# Patient Record
Sex: Male | Born: 1985 | Race: White | Hispanic: No | Marital: Married | Smoking: Never smoker
Health system: Southern US, Community
[De-identification: ages and names within clinical notes are randomized; demographics above are authoritative.]

## PROBLEM LIST (undated history)

## (undated) DIAGNOSIS — F988 Other specified behavioral and emotional disorders with onset usually occurring in childhood and adolescence: Secondary | ICD-10-CM

## (undated) DIAGNOSIS — F909 Attention-deficit hyperactivity disorder, unspecified type: Secondary | ICD-10-CM

## (undated) DIAGNOSIS — R569 Unspecified convulsions: Secondary | ICD-10-CM

## (undated) HISTORY — PX: CHOLECYSTECTOMY: SHX55

---

## 2016-10-02 ENCOUNTER — Encounter: Payer: Self-pay | Admitting: Emergency Medicine

## 2016-10-02 ENCOUNTER — Emergency Department
Admission: EM | Admit: 2016-10-02 | Discharge: 2016-10-02 | Disposition: A | Discharge status: Home / Self Care | Payer: Medicaid Other | Attending: Emergency Medicine | Admitting: Emergency Medicine

## 2016-10-02 DIAGNOSIS — R220 Localized swelling, mass and lump, head: Secondary | ICD-10-CM | POA: Diagnosis present

## 2016-10-02 DIAGNOSIS — F909 Attention-deficit hyperactivity disorder, unspecified type: Secondary | ICD-10-CM | POA: Insufficient documentation

## 2016-10-02 DIAGNOSIS — R590 Localized enlarged lymph nodes: Secondary | ICD-10-CM

## 2016-10-02 HISTORY — DX: Attention-deficit hyperactivity disorder, unspecified type: F90.9

## 2016-10-02 HISTORY — DX: Other specified behavioral and emotional disorders with onset usually occurring in childhood and adolescence: F98.8

## 2016-10-02 HISTORY — DX: Unspecified convulsions: R56.9

## 2016-10-02 NOTE — ED Triage Notes (Signed)
Developed left eye swelling a few days ago  Was seen at Urgent Care and dx'd with pink eye  Now developed some left ear pain and swollen gland  Denies any fever or other sx's

## 2016-10-02 NOTE — ED Provider Notes (Signed)
[facility] Emergency Department Provider Note   ____________________________________________    I have reviewed the triage vital signs and the nursing notes.   HISTORY  Chief Complaint Facial swelling    HPI Ronald Chandler is a 30 y.o. male who presents with complaints of 2 distinct areas of swelling and from his left ear and underneath his left jaw. Patient reports he is being treated for pinkeye the urgent care and has been using eyedrops. He reports his eye has been improving. Denies fevers or chills. Denies ear pain to me. No intraoral pain or difficulty swallowing. No nausea or vomiting. No fevers or chills.   Past Medical History:  Diagnosis Date  . ADD (attention deficit disorder)   . ADHD   . Seizures (HCC)     There are no active problems to display for this patient.   Past Surgical History:  Procedure Laterality Date  . CHOLECYSTECTOMY      Prior to Admission medications   Medication Sig Start Date End Date Taking? Authorizing Provider  lamoTRIgine (LAMICTAL) 100 MG tablet Take 100 mg by mouth daily.   Yes Historical Provider, MD     Allergies Patient has no known allergies.  No family history on file.  Social History Social History  Substance Use Topics  . Smoking status: Never Smoker  . Smokeless tobacco: Never Used  . Alcohol use No    Review of Systems  Constitutional: No fever/chills  ENT: No sore throat.No ear pain   Gastrointestinal: No nausea, no vomiting.    Musculoskeletal: Negative for myalgias Skin: Negative for rash. Neurological: Negative for headaches     ____________________________________________   PHYSICAL EXAM:  VITAL SIGNS: ED Triage Vitals  Enc Vitals Group     BP 10/02/16 0733 (!) 107/59     Pulse Rate 10/02/16 0733 93     Resp 10/02/16 0733 18     Temp 10/02/16 0733 97.5 F (36.4 C)     Temp Source 10/02/16 0733 Oral     SpO2 10/02/16 0733 100 %     Weight 10/02/16 0734  125 lb (56.7 kg)     Height 10/02/16 0734 5\' 9"  (1.753 m)     Head Circumference --      Peak Flow --      Pain Score --      Pain Loc --      Pain Edu? --      Excl. in GC? --     Constitutional: Alert and oriented. No acute distress. Pleasant and interactive Eyes: Left conjunctiva is mildly erythematous otherwise normal exam, no pain with eye movements Head: Atraumatic. Nose: No congestion/rhinnorhea. Mouth/Throat: Mucous membranes are moist. Patient with preauricular lymphadenopathy as well as anterior cervical lymphadenopathy readily palpable. Pharynx is normal. No intraoral infection noted Cardiovascular: Normal rate, regular rhythm.  Respiratory: Normal respiratory effort.  No retractions. Genitourinary: deferred Musculoskeletal: No lower extremity tenderness nor edema.   Neurologic:  Normal speech and language. No gross focal neurologic deficits are appreciated.   Skin:  Skin is warm, dry and intact. No rash noted.   ____________________________________________   LABS (all labs ordered are listed, but only abnormal results are displayed)  Labs Reviewed - No data to display ____________________________________________  EKG   ____________________________________________  RADIOLOGY  None ____________________________________________   PROCEDURES  Procedure(s) performed: No    Critical Care performed: No ____________________________________________   INITIAL IMPRESSION / ASSESSMENT AND PLAN / ED COURSE  Pertinent labs & imaging results  that were available during my care of the patient were reviewed by me and considered in my medical decision making (see chart for details).  Patient presents with likely viral illness resulting in anterior cervical and preauricular lymphadenopathy. Patient reassured. Recommend supportive care and outpatient PCP follow-up as needed. Return precautions discussed   ____________________________________________   FINAL  CLINICAL IMPRESSION(S) / ED DIAGNOSES  Final diagnoses:  Lymphadenopathy, periauricular      NEW MEDICATIONS STARTED DURING THIS VISIT:  New Prescriptions   No medications on file     Note:  This document was prepared using Dragon voice recognition software and may include unintentional dictation errors.    Jene Everyobert Trellis Vanoverbeke, MD 10/02/16 [phone]

## 2018-12-03 ENCOUNTER — Encounter: Payer: Self-pay | Admitting: Emergency Medicine

## 2018-12-03 ENCOUNTER — Other Ambulatory Visit: Payer: Self-pay

## 2018-12-03 ENCOUNTER — Emergency Department
Admission: EM | Admit: 2018-12-03 | Discharge: 2018-12-03 | Disposition: A | Discharge status: Home / Self Care | Payer: Self-pay | Attending: Emergency Medicine | Admitting: Emergency Medicine

## 2018-12-03 DIAGNOSIS — H53143 Visual discomfort, bilateral: Secondary | ICD-10-CM | POA: Insufficient documentation

## 2018-12-03 NOTE — ED Triage Notes (Signed)
Patient complaining of burning and light sensitivity both eyes for a "couple of day's".  Denies wearing lenses or drainage.  No obvious redness noted.

## 2018-12-03 NOTE — ED Provider Notes (Signed)
[facility] Emergency Department Provider Note   ____________________________________________   First MD Initiated Contact with Patient 12/03/18 [phone]     (approximate)  I have reviewed the triage vital signs and the nursing notes.   HISTORY  Chief Complaint Eye Problem    HPI Ronald Chandler is a 33 y.o. male   patient complain of 3 days of photophobia.  Patient denies trauma.  Patient do not wear contact lenses.  Patient is not a Psychologist, occupational.  Patient denies any other vision disturbance.  Patient denies eye pain.  Past Medical History:  Diagnosis Date  . ADD (attention deficit disorder)   . ADHD   . Seizures (HCC)     There are no active problems to display for this patient.   Past Surgical History:  Procedure Laterality Date  . CHOLECYSTECTOMY      Prior to Admission medications   Medication Sig Start Date End Date Taking? Authorizing Provider  lamoTRIgine (LAMICTAL) 100 MG tablet Take 100 mg by mouth daily.    [provider]    Allergies Patient has no known allergies.  No family history on file.  Social History Social History   Tobacco Use  . Smoking status: Never Smoker  . Smokeless tobacco: Never Used  Substance Use Topics  . Alcohol use: No  . Drug use: Never    Review of Systems Valium Constitutional: No fever/chills Eyes: Photophobia. ENT: No sore throat. Cardiovascular: Denies chest pain. Respiratory: Denies shortness of breath. Gastrointestinal: No abdominal pain.  No nausea, no vomiting.  No diarrhea.  No constipation. Genitourinary: Negative for dysuria. Musculoskeletal: Negative for back pain. Skin: Negative for rash. Neurological: Negative for headaches, focal weakness or numbness.   ____________________________________________   PHYSICAL EXAM:  VITAL SIGNS: ED Triage Vitals  Enc Vitals Group     BP 12/03/18 0801 (!) 109/57     Pulse Rate 12/03/18 0801 (!) 51     Resp 12/03/18 0801 12   Temp 12/03/18 0801 98 F (36.7 C)     Temp Source 12/03/18 0801 Oral     SpO2 12/03/18 0801 99 %     Weight 12/03/18 0757 125 lb (56.7 kg)     Height 12/03/18 0757 5\' 9"  (1.753 m)     Head Circumference --      Peak Flow --      Pain Score 12/03/18 0756 6     Pain Loc --      Pain Edu? --      Excl. in GC? --    Constitutional: Alert and oriented. Well appearing and in no acute distress. Eyes: Conjunctivae are normal. PERRL. EOMI. photophobic left eye greater than right. Neck: No stridor. Hematological/Lymphatic/Immunilogical: No cervical lymphadenopathy. Cardiovascular: Normal rate, regular rhythm. Grossly normal heart sounds.  Good peripheral circulation. Respiratory: Normal respiratory effort.  No retractions. Lungs CTAB. Skin:  Skin is warm, dry and intact. No rash noted. Psychiatric: Mood and affect are normal. Speech and behavior are normal.  ____________________________________________   LABS (all labs ordered are listed, but only abnormal results are displayed)  Labs Reviewed - No data to display ____________________________________________  EKG   ____________________________________________  RADIOLOGY  ED MD interpretation:    Official radiology report(s): No results found.  ____________________________________________   PROCEDURES  Procedure(s) performed: None  Procedures  Critical Care performed: No  ____________________________________________   INITIAL IMPRESSION / ASSESSMENT AND PLAN / ED COURSE  As part of my medical decision making, I reviewed the following data within  the electronic MEDICAL RECORD NUMBER     Patient presented with atraumatic photophobia.  Initial exam was unremarkable except for photophobia.  Patient will be consulted to ophthalmology for definitive evaluation and treatment.      ____________________________________________   FINAL CLINICAL IMPRESSION(S) / ED DIAGNOSES  Final diagnoses:  Photophobia, bilateral      ED Discharge Orders    None       Note:  This document was prepared using Dragon voice recognition software and may include unintentional dictation errors.    Joni Reining, PA-C 12/03/18 2233    Dionne Bucy, MD 12/03/18 [phone]

## 2018-12-03 NOTE — Discharge Instructions (Signed)
Follow-up with ophthalmology for definitive evaluation.

## 2018-12-19 ENCOUNTER — Emergency Department
Admission: EM | Admit: 2018-12-19 | Discharge: 2018-12-19 | Disposition: A | Discharge status: Home / Self Care | Payer: Self-pay | Attending: Emergency Medicine | Admitting: Emergency Medicine

## 2018-12-19 ENCOUNTER — Encounter: Payer: Self-pay | Admitting: Emergency Medicine

## 2018-12-19 ENCOUNTER — Other Ambulatory Visit: Payer: Self-pay

## 2018-12-19 ENCOUNTER — Emergency Department: Payer: Self-pay

## 2018-12-19 DIAGNOSIS — B9789 Other viral agents as the cause of diseases classified elsewhere: Secondary | ICD-10-CM

## 2018-12-19 DIAGNOSIS — J069 Acute upper respiratory infection, unspecified: Secondary | ICD-10-CM | POA: Insufficient documentation

## 2018-12-19 DIAGNOSIS — F909 Attention-deficit hyperactivity disorder, unspecified type: Secondary | ICD-10-CM | POA: Insufficient documentation

## 2018-12-19 DIAGNOSIS — Z79899 Other long term (current) drug therapy: Secondary | ICD-10-CM | POA: Insufficient documentation

## 2018-12-19 DIAGNOSIS — R05 Cough: Secondary | ICD-10-CM | POA: Insufficient documentation

## 2018-12-19 MED ORDER — PSEUDOEPH-BROMPHEN-DM 30-2-10 MG/5ML PO SYRP: 5.0000 mL | ORAL_SOLUTION | Freq: Four times a day (QID) | ORAL | 0 refills | Status: DC | PRN

## 2018-12-19 NOTE — Discharge Instructions (Addendum)
Follow-up with your primary care provider or can no clinic acute care if any continued problems.  Increase fluids.  Tylenol or ibuprofen as needed for body aches, ear pain, throat or headache.  Bromfed-DM as needed for cough and congestion.

## 2018-12-19 NOTE — ED Notes (Signed)
See triage note  Presents with body aches subjective fever and ear pain  sxs' started yesterday  Afebrile on arrival

## 2018-12-19 NOTE — ED Triage Notes (Signed)
Pt reports flu-like symptoms that started yesterday and states his left ear is painful as well.

## 2018-12-19 NOTE — ED Provider Notes (Signed)
[facility] Emergency Department Provider Note   ____________________________________________   First MD Initiated Contact with Patient 12/19/18 [phone]     (approximate)  I have reviewed the triage vital signs and the nursing notes.   HISTORY  Chief Complaint Influenza; Fever; Cough; Nasal Congestion; and Otalgia   HPI Ronald Chandler is a 33 y.o. male presents to the ED with onset of body aches, subjective fever and left ear pain since yesterday.  Patient states that he did not actually have any fever last evening but had chills.  He complains of cough but has not taken any over-the-counter medication.  He denies nausea, vomiting or diarrhea.  He rates pain as 6/10.     Past Medical History:  Diagnosis Date  . ADD (attention deficit disorder)   . ADHD   . Seizures (HCC)     There are no active problems to display for this patient.   Past Surgical History:  Procedure Laterality Date  . CHOLECYSTECTOMY      Prior to Admission medications   Medication Sig Start Date End Date Taking? Authorizing Provider  brompheniramine-pseudoephedrine-DM 30-2-10 MG/5ML syrup Take 5 mLs by mouth 4 (four) times daily as needed. 12/19/18   Tommi Rumps, PA-C  lamoTRIgine (LAMICTAL) 100 MG tablet Take 100 mg by mouth daily.    [provider]    Allergies Patient has no known allergies.  No family history on file.  Social History Social History   Tobacco Use  . Smoking status: Never Smoker  . Smokeless tobacco: Never Used  Substance Use Topics  . Alcohol use: No  . Drug use: Never    Review of Systems Constitutional: Subjective fever/chills Eyes: No visual changes. ENT: Positive sore throat.  Positive left ear pain. Cardiovascular: Denies chest pain. Respiratory: Denies shortness of breath. Gastrointestinal: No abdominal pain.  No nausea, no vomiting.  Genitourinary: Negative for dysuria. Musculoskeletal: Negative for back pain. Skin:  Negative for rash. Neurological: Negative for headaches, focal weakness or numbness. ___________________________________________   PHYSICAL EXAM:  VITAL SIGNS: ED Triage Vitals  Enc Vitals Group     BP 12/19/18 0854 (!) 118/56     Pulse Rate 12/19/18 0854 67     Resp 12/19/18 0854 20     Temp 12/19/18 0854 98.7 F (37.1 C)     Temp Source 12/19/18 0854 Oral     SpO2 12/19/18 0854 98 %     Weight 12/19/18 0855 128 lb (58.1 kg)     Height 12/19/18 0855 5\' 8"  (1.727 m)     Head Circumference --      Peak Flow --      Pain Score 12/19/18 0854 6     Pain Loc --      Pain Edu? --      Excl. in GC? --    Constitutional: Alert and oriented. Well appearing and in no acute distress. Eyes: Conjunctivae are normal.  Head: Atraumatic. Nose: No congestion/rhinnorhea.  Mouth/Throat: Mucous membranes are moist.  Oropharynx non-erythematous. Neck: No stridor.  Hematological/Lymphatic/Immunilogical: No cervical lymphadenopathy. Cardiovascular: Normal rate, regular rhythm. Grossly normal heart sounds.  Good peripheral circulation. Respiratory: Normal respiratory effort.  No retractions. Lungs CTAB. Gastrointestinal: Soft and nontender. No distention.  Musculoskeletal: No lower extremity tenderness nor edema.  No joint effusions. Neurologic:  Normal speech and language. No gross focal neurologic deficits are appreciated.  Skin:  Skin is warm, dry and intact. No rash noted. Psychiatric: Mood and affect are normal. Speech and  behavior are normal.  ____________________________________________   LABS (all labs ordered are listed, but only abnormal results are displayed)  Labs Reviewed - No data to display ____________________________________________  RADIOLOGY   Official radiology report(s): Dg Chest 2 View  Result Date: 12/19/2018 CLINICAL DATA:  Cough and cold symptoms. EXAM: CHEST - 2 VIEW COMPARISON:  None. FINDINGS: The cardiac silhouette, mediastinal hilar contours are normal. The  lungs are clear. No pleural effusion. The bony thorax is intact. IMPRESSION: No acute cardiopulmonary findings. Electronically Signed   By: Rudie Meyer M.D.   On: 12/19/2018 09:34    ____________________________________________   PROCEDURES  Procedure(s) performed (including Critical Care):  Procedures   ____________________________________________   INITIAL IMPRESSION / ASSESSMENT AND PLAN / ED COURSE  As part of my medical decision making, I reviewed the following data within the electronic MEDICAL RECORD NUMBER Notes from prior ED visits and [place] Controlled Substance Database     Patient presents to the ED with complaint of body aches, subjective fever and left ear pain that started yesterday.  Patient has not taken any over-the-counter medication.  Physical exam is consistent with a viral URI.  Patient was given a prescription for Bromfed-DM.  He is to follow-up with his PCP or [facility] clinic acute care if any continued problems.   ____________________________________________   FINAL CLINICAL IMPRESSION(S) / ED DIAGNOSES  Final diagnoses:  Viral URI with cough     ED Discharge Orders         Ordered    brompheniramine-pseudoephedrine-DM 30-2-10 MG/5ML syrup  4 times daily PRN     12/19/18 0958           Note:  This document was prepared using Dragon voice recognition software and may include unintentional dictation errors.    Tommi Rumps, PA-C 12/19/18 1410    Jene Every, MD 12/19/18 [phone]

## 2019-01-01 ENCOUNTER — Other Ambulatory Visit: Payer: Self-pay

## 2019-01-01 ENCOUNTER — Emergency Department
Admission: EM | Admit: 2019-01-01 | Discharge: 2019-01-01 | Disposition: A | Discharge status: Home / Self Care | Payer: Self-pay | Attending: Emergency Medicine | Admitting: Emergency Medicine

## 2019-01-01 ENCOUNTER — Encounter: Payer: Self-pay | Admitting: Emergency Medicine

## 2019-01-01 DIAGNOSIS — Z79899 Other long term (current) drug therapy: Secondary | ICD-10-CM | POA: Insufficient documentation

## 2019-01-01 DIAGNOSIS — F17200 Nicotine dependence, unspecified, uncomplicated: Secondary | ICD-10-CM | POA: Insufficient documentation

## 2019-01-01 DIAGNOSIS — R202 Paresthesia of skin: Secondary | ICD-10-CM | POA: Insufficient documentation

## 2019-01-01 MED ORDER — METHYLPREDNISOLONE 4 MG PO TBPK: ORAL_TABLET | ORAL | 0 refills | Status: DC

## 2019-01-01 NOTE — ED Triage Notes (Signed)
Pt in via POV, states he was playing with his daughter and heard a "pop" in his hand.  Since, hand has been tingling, numb, cold to touch.  Full ROM noted, strong right radial pulse palpated.  Ambulatory to triage, NAD noted.

## 2019-01-01 NOTE — Discharge Instructions (Addendum)
Follow discharge care instruction wear wrist splint for 2 to 3 days as needed. °

## 2019-01-01 NOTE — ED Provider Notes (Signed)
[facility] Emergency Department Provider Note  ____________________________________________   First MD Initiated Contact with Patient 01/01/19 0805     (approximate)  I have reviewed the triage vital signs and the nursing notes.   HISTORY  Chief Complaint Hand Injury    HPI Ronald Chandler is a 33 y.o. male patient complain of numbness to the right hand.  Patient that he was playing with his daughter last night when he felt a "pop" in his hand.  Since incident patient has tingling and numbness and stated pain is cold to touch.  Patient denies decreased range of motion.  Patient is left-hand dominant.         Past Medical History:  Diagnosis Date  . ADD (attention deficit disorder)   . ADHD   . Seizures (HCC)     There are no active problems to display for this patient.   Past Surgical History:  Procedure Laterality Date  . CHOLECYSTECTOMY      Prior to Admission medications   Medication Sig Start Date End Date Taking? Authorizing Provider  lamoTRIgine (LAMICTAL) 100 MG tablet Take 100 mg by mouth daily.    [provider]  methylPREDNISolone (MEDROL DOSEPAK) 4 MG TBPK tablet Take Tapered dose as directed 01/01/19   Joni Reining, PA-C    Allergies Patient has no known allergies.  No family history on file.  Social History Social History   Tobacco Use  . Smoking status: Current Some Day Smoker  . Smokeless tobacco: Never Used  Substance Use Topics  . Alcohol use: No  . Drug use: Never    Review of Systems Constitutional: No fever/chills Eyes: No visual changes. ENT: No sore throat. Cardiovascular: Denies chest pain. Respiratory: Denies shortness of breath. Gastrointestinal: No abdominal pain.  No nausea, no vomiting.  No diarrhea.  No constipation. Genitourinary: Negative for dysuria. Musculoskeletal: Negative for back pain. Skin: Negative for rash. Neurological: Numbness to right hand.   Psychiatric:  ADHD.   ____________________________________________   PHYSICAL EXAM:  VITAL SIGNS: ED Triage Vitals  Enc Vitals Group     BP 01/01/19 0748 113/66     Pulse Rate 01/01/19 0748 73     Resp 01/01/19 0748 16     Temp 01/01/19 0748 98 F (36.7 C)     Temp Source 01/01/19 0748 Oral     SpO2 01/01/19 0748 96 %     Weight 01/01/19 0745 132 lb (59.9 kg)     Height 01/01/19 0745 5\' 9"  (1.753 m)     Head Circumference --      Peak Flow --      Pain Score 01/01/19 0745 0     Pain Loc --      Pain Edu? --      Excl. in GC? --     Constitutional: Alert and oriented. Well appearing and in no acute distress. Neck: No cervical spine tenderness to palpation. Cardiovascular: Normal rate, regular rhythm. Grossly normal heart sounds.  Good radial pulse.  Capillary refill less than 2 seconds. Respiratory: Normal respiratory effort.  No retractions. Lungs CTAB. Musculoskeletal: No obvious deformity to the right hand.  Patient has full and equal range of motion of the hand...  . Neurologic:  No gross focal neurologic deficits are appreciated.  Negative Tinel's and Phalen's.   Skin:  Skin is warm, dry and intact. No rash noted. Psychiatric: Mood and affect are normal. Speech and behavior are normal.  ____________________________________________   LABS (all labs  ordered are listed, but only abnormal results are displayed)  Labs Reviewed - No data to display ____________________________________________  EKG   ____________________________________________  RADIOLOGY  ED MD interpretation:    Official radiology report(s): No results found.  ____________________________________________   PROCEDURES  Procedure(s) performed (including Critical Care):  Procedures   ____________________________________________   INITIAL IMPRESSION / ASSESSMENT AND PLAN / ED COURSE  As part of my medical decision making, I reviewed the following data within the electronic MEDICAL RECORD NUMBER          Right hand paresthesia.  Patient given discharge care instructions.  Patient placed in hands splint and given a short course of steroids.  Patient advised follow-up with neurology if no improvement or worsening complaint in the next 2 to 3 days.      ____________________________________________   FINAL CLINICAL IMPRESSION(S) / ED DIAGNOSES  Final diagnoses:  Right hand paresthesia     ED Discharge Orders         Ordered    methylPREDNISolone (MEDROL DOSEPAK) 4 MG TBPK tablet     01/01/19 0809           Note:  This document was prepared using Dragon voice recognition software and may include unintentional dictation errors.    Joni Reining, PA-C 01/01/19 1443    Nita Sickle, MD 01/01/19 [phone]

## 2019-01-01 NOTE — ED Notes (Signed)
See triage note   States he developed pain to right hand after playing with his daughter  States he felt a "pop"   And states he has had cold sensation  Hand is warm to touch at presents with good pulses  Grip equal

## 2019-04-11 ENCOUNTER — Emergency Department
Admission: EM | Admit: 2019-04-11 | Discharge: 2019-04-11 | Disposition: A | Discharge status: Home / Self Care | Payer: Medicaid Other | Attending: Emergency Medicine | Admitting: Emergency Medicine

## 2019-04-11 ENCOUNTER — Other Ambulatory Visit: Payer: Self-pay

## 2019-04-11 ENCOUNTER — Encounter: Payer: Self-pay | Admitting: Intensive Care

## 2019-04-11 DIAGNOSIS — Z79899 Other long term (current) drug therapy: Secondary | ICD-10-CM | POA: Insufficient documentation

## 2019-04-11 DIAGNOSIS — R300 Dysuria: Secondary | ICD-10-CM | POA: Insufficient documentation

## 2019-04-11 DIAGNOSIS — F17200 Nicotine dependence, unspecified, uncomplicated: Secondary | ICD-10-CM | POA: Insufficient documentation

## 2019-04-11 DIAGNOSIS — R569 Unspecified convulsions: Secondary | ICD-10-CM | POA: Insufficient documentation

## 2019-04-11 LAB — URINALYSIS, COMPLETE (UACMP) WITH MICROSCOPIC
Bacteria, UA: NONE SEEN
Bilirubin Urine: NEGATIVE
Glucose, UA: NEGATIVE mg/dL
Hgb urine dipstick: NEGATIVE
Ketones, ur: NEGATIVE mg/dL
Nitrite: NEGATIVE
Protein, ur: NEGATIVE mg/dL
Specific Gravity, Urine: 1.017 (ref 1.005–1.030)
Squamous Epithelial / LPF: NONE SEEN (ref 0–5)
pH: 7 (ref 5.0–8.0)

## 2019-04-11 MED ORDER — PHENAZOPYRIDINE HCL 200 MG PO TABS: 200.0000 mg | ORAL_TABLET | Freq: Three times a day (TID) | ORAL | 0 refills | Status: DC | PRN

## 2019-04-11 MED ORDER — SULFAMETHOXAZOLE-TRIMETHOPRIM 800-160 MG PO TABS: 1.0000 | ORAL_TABLET | Freq: Two times a day (BID) | ORAL | 0 refills | Status: DC

## 2019-04-11 NOTE — ED Notes (Signed)
See triage note States he developed some burning with urination  And some discomfort in lower abd  No fever or discharge

## 2019-04-11 NOTE — ED Triage Notes (Signed)
Patient c/o burning upon urination since yesterday. Denies abnormal discharge. No rash

## 2019-04-11 NOTE — ED Provider Notes (Signed)
[facility] Emergency Department Provider Note   ____________________________________________   First MD Initiated Contact with Patient 04/11/19 1239     (approximate)  I have reviewed the triage vital signs and the nursing notes.   HISTORY  Chief Complaint Urinary Tract Infection    HPI Ronald Chandler is a 33 y.o. male patient presents with 1 day of dysuria.  Patient denies urinary frequency/urgency.  Patient denies urethral discharge.  Patient is in a monogamous relationship.  Patient denies fever/chills.  Patient denies flank pain.  Patient rates pain as a 6/10.  Patient described the pain as "burning".  No palliative measure for complaint.  Patient stated left work early days secondary to concern of complaint.         Past Medical History:  Diagnosis Date  . ADD (attention deficit disorder)   . ADHD   . Seizures ([place])     There are no active problems to display for this patient.   Past Surgical History:  Procedure Laterality Date  . CHOLECYSTECTOMY      Prior to Admission medications   Medication Sig Start Date End Date Taking? Authorizing Provider  lamoTRIgine (LAMICTAL) 100 MG tablet Take 100 mg by mouth daily.    [provider]  phenazopyridine (PYRIDIUM) 200 MG tablet Take 1 tablet (200 mg total) by mouth 3 (three) times daily as needed for pain. 04/11/19   Sable Feil, PA-C  sulfamethoxazole-trimethoprim (BACTRIM DS) 800-160 MG tablet Take 1 tablet by mouth 2 (two) times daily. 04/11/19   Sable Feil, PA-C    Allergies Patient has no known allergies.  History reviewed. No pertinent family history.  Social History Social History   Tobacco Use  . Smoking status: Current Some Day Smoker  . Smokeless tobacco: Never Used  Substance Use Topics  . Alcohol use: No  . Drug use: Yes    Types: Marijuana    Review of Systems  Constitutional: No fever/chills Eyes: No visual changes. ENT: No sore throat.  Cardiovascular: Denies chest pain. Respiratory: Denies shortness of breath. Gastrointestinal: No abdominal pain.  No nausea, no vomiting.  No diarrhea.  No constipation. Genitourinary: Positive for dysuria. Musculoskeletal: Negative for back pain. Skin: Negative for rash. Neurological: Negative for headaches, focal weakness or numbness.  Seizures. Psychiatric:  ADD  ____________________________________________   PHYSICAL EXAM:  VITAL SIGNS: ED Triage Vitals  Enc Vitals Group     BP 04/11/19 1240 122/70     Pulse Rate 04/11/19 1240 78     Resp 04/11/19 1240 18     Temp 04/11/19 1240 98.6 F (37 C)     Temp Source 04/11/19 1240 Oral     SpO2 04/11/19 1240 99 %     Weight 04/11/19 1205 126 lb (57.2 kg)     Height 04/11/19 1205 5\' 9"  (1.753 m)     Head Circumference --      Peak Flow --      Pain Score 04/11/19 1205 6     Pain Loc --      Pain Edu? --      Excl. in [place]? --    Constitutional: Alert and oriented. Well appearing and in no acute distress. Cardiovascular: Normal rate, regular rhythm. Grossly normal heart sounds.  Good peripheral circulation. Respiratory: Normal respiratory effort.  No retractions. Lungs CTAB. Gastrointestinal: Soft and nontender. No distention. No abdominal bruits. No CVA tenderness. Genitourinary: No visible lesions.  No obvious urethral discharge. Musculoskeletal: No lower extremity tenderness nor  edema.  No joint effusions. Neurologic:  Normal speech and language. No gross focal neurologic deficits are appreciated. No gait instability. Skin:  Skin is warm, dry and intact. No rash noted. Psychiatric: Mood and affect are normal. Speech and behavior are normal.  ____________________________________________   LABS (all labs ordered are listed, but only abnormal results are displayed)  Labs Reviewed  URINALYSIS, COMPLETE (UACMP) WITH MICROSCOPIC - Abnormal; Notable for the following components:      Result Value   Color, Urine YELLOW (*)     APPearance CLEAR (*)    Leukocytes,Ua TRACE (*)    All other components within normal limits   ____________________________________________  EKG   ____________________________________________  RADIOLOGY  ED MD interpretation:    Official radiology report(s): No results found.  ____________________________________________   PROCEDURES  Procedure(s) performed (including Critical Care):  Procedures   ____________________________________________   INITIAL IMPRESSION / ASSESSMENT AND PLAN / ED COURSE  As part of my medical decision making, I reviewed the following data within the electronic MEDICAL RECORD NUMBER         Patient presents with 1 day of dysuria.  Patient denies urinary frequency/urgency.  Patient denies urethral discharge.  Patient UA shows a trace of leukocytes.  Patient will be treated prophylactically with Bactrim DS and Pyridium.  Patient [facility] if condition worsens.      ____________________________________________   FINAL CLINICAL IMPRESSION(S) / ED DIAGNOSES  Final diagnoses:  Dysuria     ED Discharge Orders         Ordered    sulfamethoxazole-trimethoprim (BACTRIM DS) 800-160 MG tablet  2 times daily     04/11/19 1243    phenazopyridine (PYRIDIUM) 200 MG tablet  3 times daily PRN     04/11/19 1243           Note:  This document was prepared using Dragon voice recognition software and may include unintentional dictation errors.    Joni ReiningSmith, Charman Blasco K, PA-C 04/11/19 1248    Emily FilbertWilliams, Jonathan E, MD 04/11/19 1322

## 2019-04-12 ENCOUNTER — Emergency Department
Admission: EM | Admit: 2019-04-12 | Discharge: 2019-04-12 | Disposition: A | Discharge status: Home / Self Care | Payer: Medicaid Other | Attending: Emergency Medicine | Admitting: Emergency Medicine

## 2019-04-12 ENCOUNTER — Other Ambulatory Visit: Payer: Self-pay

## 2019-04-12 DIAGNOSIS — R21 Rash and other nonspecific skin eruption: Secondary | ICD-10-CM | POA: Insufficient documentation

## 2019-04-12 DIAGNOSIS — F1210 Cannabis abuse, uncomplicated: Secondary | ICD-10-CM | POA: Insufficient documentation

## 2019-04-12 DIAGNOSIS — F17200 Nicotine dependence, unspecified, uncomplicated: Secondary | ICD-10-CM | POA: Insufficient documentation

## 2019-04-12 MED ORDER — AMOXICILLIN 500 MG PO CAPS: 500.0000 mg | ORAL_CAPSULE | Freq: Three times a day (TID) | ORAL | 0 refills | Status: DC

## 2019-04-12 NOTE — ED Triage Notes (Signed)
Pt states that he was seen in ED yesterday for UTI and given prescriptions - he reports that since taking the medication he has urgency with voiding and only voided x2 today - pt reports that while at work he had a "rash" break out on his neck and his supervisor told him to come to the ED - no rash noted at this time - pt denies any pain or SHOB - no difficulty swallowing

## 2019-04-12 NOTE — ED Provider Notes (Signed)
[facility] Emergency Department Provider Note   ____________________________________________   None    (approximate)  I have reviewed the triage vital signs and the nursing notes.   HISTORY  Chief Complaint Rash    HPI Ronald Chandler is a 33 y.o. male patient presents for rash that presents around his neck to the upper chest.  Patient was diagnosed with UTI yesterday and started on Bactrim DS.  Patient denies itching with the rash.  Patient stated rash started on second doses.  Patient is not taking a doses today.  No rash present at this time.  No personal family history of allergic reactions to sulfa medications.  Patient states took Benadryl earlier today.         Past Medical History:  Diagnosis Date  . ADD (attention deficit disorder)   . ADHD   . Seizures ([place])     There are no active problems to display for this patient.   Past Surgical History:  Procedure Laterality Date  . CHOLECYSTECTOMY      Prior to Admission medications   Medication Sig Start Date End Date Taking? Authorizing Provider  amoxicillin (AMOXIL) 500 MG capsule Take 1 capsule (500 mg total) by mouth 3 (three) times daily. 04/12/19   Sable Feil, PA-C  lamoTRIgine (LAMICTAL) 100 MG tablet Take 100 mg by mouth daily.    [provider]  phenazopyridine (PYRIDIUM) 200 MG tablet Take 1 tablet (200 mg total) by mouth 3 (three) times daily as needed for pain. 04/11/19   Sable Feil, PA-C  sulfamethoxazole-trimethoprim (BACTRIM DS) 800-160 MG tablet Take 1 tablet by mouth 2 (two) times daily. 04/11/19   Sable Feil, PA-C    Allergies Patient has no known allergies.  No family history on file.  Social History Social History   Tobacco Use  . Smoking status: Current Some Day Smoker  . Smokeless tobacco: Never Used  Substance Use Topics  . Alcohol use: No  . Drug use: Yes    Types: Marijuana    Review of Systems Constitutional: No fever/chills  Eyes: No visual changes. ENT: No sore throat. Cardiovascular: Denies chest pain. Respiratory: Denies shortness of breath. Gastrointestinal: No abdominal pain.  No nausea, no vomiting.  No diarrhea.  No constipation. Genitourinary: Negative for dysuria. Musculoskeletal: Negative for back pain. Skin: Negative for rash. Neurological: Negative for headaches, focal weakness or numbness.  Seizures. Psychiatric:  ADD.  ____________________________________________   PHYSICAL EXAM:  VITAL SIGNS: ED Triage Vitals  Enc Vitals Group     BP 04/12/19 1418 127/84     Pulse Rate 04/12/19 1418 69     Resp 04/12/19 1418 15     Temp 04/12/19 1418 98.4 F (36.9 C)     Temp Source 04/12/19 1418 Oral     SpO2 04/12/19 1418 99 %     Weight 04/12/19 1419 130 lb (59 kg)     Height 04/12/19 1419 5\' 9"  (1.753 m)     Head Circumference --      Peak Flow --      Pain Score 04/12/19 1419 0     Pain Loc --      Pain Edu? --      Excl. in [place]? --    Constitutional: Alert and oriented. Well appearing and in no acute distress. Cardiovascular: Normal rate, regular rhythm. Grossly normal heart sounds.  Good peripheral circulation. Respiratory: Normal respiratory effort.  No retractions. Lungs CTAB. Genitourinary: Deferred Skin:  Skin is  warm, dry and intact. No rash noted. Psychiatric: Mood and affect are normal. Speech and behavior are normal.  ____________________________________________   LABS (all labs ordered are listed, but only abnormal results are displayed)  Labs Reviewed - No data to display ____________________________________________  EKG   ____________________________________________  RADIOLOGY  ED MD interpretation:    Official radiology report(s): No results found.  ____________________________________________   PROCEDURES  Procedure(s) performed (including Critical Care):  Procedures   ____________________________________________   INITIAL IMPRESSION /  ASSESSMENT AND PLAN / ED COURSE  As part of my medical decision making, I reviewed the following data within the electronic MEDICAL RECORD NUMBER         Patient presents with rash to his neck and upper body which occurred after taking Bactrim DS.  Patient denies itching or any other anaphylactic signs and symptoms.  Discussed rationale for discontinuing medication although low suspicion since this seems to be a localized reaction.  Will change Bactrim to amoxicillin and have patient [facility] as [facility].      ____________________________________________   FINAL CLINICAL IMPRESSION(S) / ED DIAGNOSES  Final diagnoses:  Rash     ED Discharge Orders         Ordered    amoxicillin (AMOXIL) 500 MG capsule  3 times daily     04/12/19 1511           Note:  This document was prepared using Dragon voice recognition software and may include unintentional dictation errors.    Joni ReiningSmith,  K, PA-C 04/12/19 1517    Emily FilbertWilliams, Jonathan E, MD 04/12/19 [phone]

## 2019-04-12 NOTE — Discharge Instructions (Addendum)
There is a low suspicion that your rash is caused by Bactrim which was prescribed for yesterday.  However we will discontinue medication and start you on amoxicillin.  If rash reoccurs be advised not from the medication.

## 2019-04-28 ENCOUNTER — Emergency Department: Payer: Self-pay

## 2019-04-28 ENCOUNTER — Encounter: Payer: Self-pay | Admitting: Emergency Medicine

## 2019-04-28 ENCOUNTER — Emergency Department
Admission: EM | Admit: 2019-04-28 | Discharge: 2019-04-28 | Disposition: A | Discharge status: Home / Self Care | Payer: Self-pay | Attending: Emergency Medicine | Admitting: Emergency Medicine

## 2019-04-28 ENCOUNTER — Other Ambulatory Visit: Payer: Self-pay

## 2019-04-28 DIAGNOSIS — Y92007 Garden or yard of unspecified non-institutional (private) residence as the place of occurrence of the external cause: Secondary | ICD-10-CM | POA: Insufficient documentation

## 2019-04-28 DIAGNOSIS — F17210 Nicotine dependence, cigarettes, uncomplicated: Secondary | ICD-10-CM | POA: Insufficient documentation

## 2019-04-28 DIAGNOSIS — Z79899 Other long term (current) drug therapy: Secondary | ICD-10-CM | POA: Insufficient documentation

## 2019-04-28 DIAGNOSIS — W2209XA Striking against other stationary object, initial encounter: Secondary | ICD-10-CM | POA: Insufficient documentation

## 2019-04-28 DIAGNOSIS — Y93H2 Activity, gardening and landscaping: Secondary | ICD-10-CM | POA: Insufficient documentation

## 2019-04-28 DIAGNOSIS — Y999 Unspecified external cause status: Secondary | ICD-10-CM | POA: Insufficient documentation

## 2019-04-28 DIAGNOSIS — S0990XA Unspecified injury of head, initial encounter: Secondary | ICD-10-CM | POA: Insufficient documentation

## 2019-04-28 MED ORDER — IBUPROFEN 600 MG PO TABS: 600.0000 mg | ORAL_TABLET | Freq: Three times a day (TID) | ORAL | 0 refills | Status: DC | PRN

## 2019-04-28 NOTE — ED Triage Notes (Signed)
Pt to ED via POV stating that he was cutting grass yesterday and he hit his head on a metal laundry pole. Pt states that he is now having tightness and in his jaw and intermittent swelling in his left eye. No swelling noted at this time. Pt is in NAD.

## 2019-04-28 NOTE — ED Provider Notes (Signed)
[facility] Emergency Department Provider Note  ____________________________________________   First MD Initiated Contact with Patient 04/28/19 [phone]     (approximate)  I have reviewed the triage vital signs and the nursing notes.   HISTORY  Chief Complaint Head Injury    HPI Ronald Chandler is a 33 y.o. male with history of seizure disorder, ADHD, here with headache.  The patient states that he was doing yard work the day before yesterday.  He states that he backed into a pole accidentally and struck his head.  He briefly "saw stars" but did not actually pass out.  Since then, has had a moderate, aching, posterior headache that radiates around towards his eye.  He woke up today and his fiance thought that his left face looked swollen so he presents for evaluation.  He states that this has happened before because is dentures fit differently, though he denies any tooth or gum pain.  Denies any other trauma.  No neck pain or neck stiffness.  He does have some mild photophobia on the left.  No actual eye pain.  The pain is worse with palpation of the head.  No alleviating factors.  He has not taken anything.        Past Medical History:  Diagnosis Date  . ADD (attention deficit disorder)   . ADHD   . Seizures ([facility])     There are no active problems to display for this patient.   Past Surgical History:  Procedure Laterality Date  . CHOLECYSTECTOMY      Prior to Admission medications   Medication Sig Start Date End Date Taking? Authorizing Provider  amoxicillin (AMOXIL) 500 MG capsule Take 1 capsule (500 mg total) by mouth 3 (three) times daily. 04/12/19   Joni ReiningSmith, Ronald K, PA-C  ibuprofen (ADVIL) 600 MG tablet Take 1 tablet (600 mg total) by mouth every 8 (eight) hours as needed for moderate pain. 04/28/19   Shaune PollackIsaacs, Kaylan Yates, MD  lamoTRIgine (LAMICTAL) 100 MG tablet Take 100 mg by mouth daily.    [provider]  phenazopyridine (PYRIDIUM) 200 MG  tablet Take 1 tablet (200 mg total) by mouth 3 (three) times daily as needed for pain. 04/11/19   Joni ReiningSmith, Ronald K, PA-C  sulfamethoxazole-trimethoprim (BACTRIM DS) 800-160 MG tablet Take 1 tablet by mouth 2 (two) times daily. 04/11/19   Joni ReiningSmith, Ronald K, PA-C    Allergies Patient has no known allergies.  No family history on file.  Social History Social History   Tobacco Use  . Smoking status: Current Some Day Smoker  . Smokeless tobacco: Never Used  Substance Use Topics  . Alcohol use: No  . Drug use: Yes    Types: Marijuana    Review of Systems  Review of Systems  Constitutional: Positive for fatigue. Negative for chills and fever.  HENT: Negative for sore throat.   Respiratory: Negative for shortness of breath.   Cardiovascular: Negative for chest pain.  Gastrointestinal: Negative for abdominal pain.  Genitourinary: Negative for flank pain.  Musculoskeletal: Negative for neck pain.  Skin: Negative for rash and wound.  Allergic/Immunologic: Negative for immunocompromised state.  Neurological: Positive for headaches. Negative for weakness and numbness.  Hematological: Does not bruise/bleed easily.     ____________________________________________  PHYSICAL EXAM:      VITAL SIGNS: ED Triage Vitals  Enc Vitals Group     BP 04/28/19 0706 108/68     Pulse Rate 04/28/19 0706 (!) 44     Resp 04/28/19 0706  16     Temp 04/28/19 0706 97.7 F (36.5 C)     Temp Source 04/28/19 0706 Oral     SpO2 04/28/19 0706 100 %     Weight 04/28/19 0707 130 lb (59 kg)     Height 04/28/19 0707 5\' 9"  (1.753 m)     Head Circumference --      Peak Flow --      Pain Score 04/28/19 0707 0     Pain Loc --      Pain Edu? --      Excl. in [place]? --      Physical Exam Vitals signs and nursing note reviewed.  Constitutional:      General: He is not in acute distress.    Appearance: He is well-developed.  HENT:     Head: Normocephalic and atraumatic.     Comments: Mild tenderness over the  left posterior occiput without appreciable swelling.  No crepitance or bony instability.  No hemotympanum bilaterally.  No periorbital or postauricular ecchymoses. Eyes:     Conjunctiva/sclera: Conjunctivae normal.  Neck:     Musculoskeletal: Neck supple.  Cardiovascular:     Rate and Rhythm: Normal rate and regular rhythm.     Heart sounds: Normal heart sounds. No murmur. No friction rub.  Pulmonary:     Effort: Pulmonary effort is normal. No respiratory distress.     Breath sounds: Normal breath sounds. No wheezing or rales.  Abdominal:     General: There is no distension.     Palpations: Abdomen is soft.     Tenderness: There is no abdominal tenderness.  Skin:    General: Skin is warm.     Capillary Refill: Capillary refill takes less than 2 seconds.  Neurological:     Mental Status: He is alert and oriented to person, place, and time.     Motor: No abnormal muscle tone.       ____________________________________________   LABS (all labs ordered are listed, but only abnormal results are displayed)  Labs Reviewed - No data to display  ____________________________________________  EKG: Sinus bradycardia, ventricular rate 48.  PR 136, QRS 102, QTc 380.  Benign early re-pole.  No acute ischemic changes. ________________________________________  RADIOLOGY All imaging, including plain films, CT scans, and ultrasounds, independently reviewed by me, and interpretations confirmed via formal radiology reads.  ED MD interpretation:   CT Head: [place]  Official radiology report(s): Ct Head Wo Contrast  Result Date: 04/28/2019 CLINICAL DATA:  Headache after hitting head against solid object EXAM: CT HEAD WITHOUT CONTRAST TECHNIQUE: Contiguous axial images were obtained from the base of the skull through the vertex without intravenous contrast. COMPARISON:  None. FINDINGS: Brain: The ventricles are normal in size and configuration. There is prominence of the cisterna magna, an  anatomic variant. There is no intracranial mass, hemorrhage, extra-axial fluid collection, or midline shift. Brain parenchyma appears unremarkable. No acute infarct evident. Vascular: There is no hyperdense vessel. There is no appreciable vascular calcification. Skull: The bony calvarium appears intact. Sinuses/Orbits: There is a small retention cyst in a right ethmoid air cell. Other visualized paranasal sinuses are clear. Visualized orbits appear symmetric bilaterally. Other: Mastoid air cells are clear. IMPRESSION: Small retention cyst in a right ethmoid air cell. Study otherwise unremarkable. 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 Electronically Signed   By: Lowella Grip III M.D.   On: 04/28/2019 08:25    ____________________________________________  PROCEDURES   Procedure(s) performed (including Critical Care):  Procedures  ____________________________________________  INITIAL IMPRESSION / MDM / ASSESSMENT AND PLAN / ED COURSE  As part of my medical decision making, I reviewed the following data within the electronic MEDICAL RECORD NUMBER Notes from prior ED visits and [place] Controlled Substance Databases      *Ronald Chandler was evaluated in Emergency Department on 04/28/2019 for the symptoms described in the history of present illness. He was evaluated in the context of the global COVID-19 pandemic, which necessitated consideration that the patient might be at risk for infection with the SARS-CoV-2 virus that causes COVID-19. Institutional protocols and algorithms that pertain to the evaluation of patients at risk for COVID-19 are in a state of rapid change based on information released by regulatory bodies including the CDC and federal and state organizations. These policies and algorithms were followed during the patient's care in the ED.  Some ED evaluations and interventions may be delayed as a result of limited staffing during the pandemic.*   Clinical Course as of Apr 27 838   Sun Apr 28, 2019  0805 33 yo M here with headache after head injury 2 days ago. Mild bradycardia noted on arrival. While this could be his baseline, given temporal pain, +trauma, reported hematoma, will check CT. Swelling could also be 2/2 poorly fitting dentures though no signs of gingival or alveolar ridge infection on my exam. F/u CT.   [CI]  [id number] CT head shows NAICA. Suspect his bradycardia is physiologic. Will treat for possible mild concussion, provide w/ work note, and d/c home.   [CI]    Clinical Course User Index [CI] Shaune PollackIsaacs, Abbegale Stehle, MD    Medical Decision Making: As above.  ____________________________________________  FINAL CLINICAL IMPRESSION(S) / ED DIAGNOSES  Final diagnoses:  Injury of head, initial encounter     MEDICATIONS GIVEN DURING THIS VISIT:  Medications - No data to display   ED Discharge Orders         Ordered    ibuprofen (ADVIL) 600 MG tablet  Every 8 hours PRN     04/28/19 0838           Note:  This document was prepared using Dragon voice recognition software and may include unintentional dictation errors.   Shaune PollackIsaacs, Pretty Weltman, MD 04/28/19 [phone]

## 2019-04-28 NOTE — ED Notes (Signed)
Pt says he was mowing the grass yesterday when he backed up and hit the back left side of his head on a metal pole hanging in his neighbor's yard; pt says his left cheek and jaw line feel tight; and he says his wife told him he looked like he has some swelling under his left eye this am; no swelling observed by this RN at this time

## 2019-06-28 ENCOUNTER — Emergency Department
Admission: EM | Admit: 2019-06-28 | Discharge: 2019-06-28 | Disposition: A | Discharge status: Home / Self Care | Payer: Medicaid Other | Attending: Emergency Medicine | Admitting: Emergency Medicine

## 2019-06-28 ENCOUNTER — Other Ambulatory Visit: Payer: Self-pay

## 2019-06-28 DIAGNOSIS — J040 Acute laryngitis: Secondary | ICD-10-CM | POA: Insufficient documentation

## 2019-06-28 DIAGNOSIS — J029 Acute pharyngitis, unspecified: Secondary | ICD-10-CM

## 2019-06-28 DIAGNOSIS — Z79899 Other long term (current) drug therapy: Secondary | ICD-10-CM | POA: Insufficient documentation

## 2019-06-28 DIAGNOSIS — F909 Attention-deficit hyperactivity disorder, unspecified type: Secondary | ICD-10-CM | POA: Insufficient documentation

## 2019-06-28 DIAGNOSIS — F17210 Nicotine dependence, cigarettes, uncomplicated: Secondary | ICD-10-CM | POA: Insufficient documentation

## 2019-06-28 LAB — GROUP A STREP BY PCR: Group A Strep by PCR: NOT DETECTED

## 2019-06-28 MED ORDER — LIDOCAINE VISCOUS HCL 2 % MT SOLN
5.0000 mL | Freq: Four times a day (QID) | OROMUCOSAL | 0 refills | Status: AC | PRN
Start: 2019-06-28 — End: ?

## 2019-06-28 MED ORDER — METHYLPREDNISOLONE 4 MG PO TBPK: ORAL_TABLET | ORAL | 0 refills | Status: AC

## 2019-06-28 NOTE — ED Notes (Addendum)
See triage note  Presents with sore throat   Afebrile   Also noticed that voice is hoarse  No diff swallowing    Denies any pain

## 2019-06-28 NOTE — ED Provider Notes (Signed)
[facility] Emergency Department Provider Note   ____________________________________________   First MD Initiated Contact with Patient 06/28/19 0912     (approximate)  I have reviewed the triage vital signs and the nursing notes.   HISTORY  Chief Complaint Hoarse and Sore Throat    HPI Ronald Chandler is a 33 y.o. male patient waking this morning with decreased voice volume and sore throat.  Patient denies other URI signs and symptoms.  Patient denies recent travel.  Patient denies knowledge of recent exposure to COVID-19.  Patient is able to tolerate food and fluids.  Patient rates his pain as a 6/10.  Patient described the pain as "sore.  No palliative measure for complaint.         Past Medical History:  Diagnosis Date  . ADD (attention deficit disorder)   . ADHD   . Seizures (HCC)     There are no active problems to display for this patient.   Past Surgical History:  Procedure Laterality Date  . CHOLECYSTECTOMY      Prior to Admission medications   Medication Sig Start Date End Date Taking? Authorizing Provider  lamoTRIgine (LAMICTAL) 100 MG tablet Take 100 mg by mouth daily.    [provider]  lidocaine (XYLOCAINE) 2 % solution Use as directed 5 mLs in the mouth or throat every 6 (six) hours as needed for mouth pain. Oral swish and swallow. 06/28/19   Joni ReiningSmith, Nylene Inlow K, PA-C  methylPREDNISolone (MEDROL DOSEPAK) 4 MG TBPK tablet Take Tapered dose as directed 06/28/19   Joni ReiningSmith, Ari Bernabei K, PA-C    Allergies Patient has no known allergies.  No family history on file.  Social History Social History   Tobacco Use  . Smoking status: Current Some Day Smoker  . Smokeless tobacco: Never Used  Substance Use Topics  . Alcohol use: No  . Drug use: Yes    Types: Marijuana    Review of Systems Constitutional: No fever/chills Eyes: No visual changes. ENT: No sore throat. Cardiovascular: Denies chest pain. Respiratory: Denies  shortness of breath. Gastrointestinal: No abdominal pain.  No nausea, no vomiting.  No diarrhea.  No constipation. Genitourinary: Negative for dysuria. Musculoskeletal: Negative for back pain. Skin: Negative for rash. Neurological: Negative for headaches, focal weakness or numbness.  Stress seizures. Psychiatric:  ADD ____________________________________________   PHYSICAL EXAM:  VITAL SIGNS: ED Triage Vitals  Enc Vitals Group     BP 06/28/19 0848 109/67     Pulse Rate 06/28/19 0848 72     Resp 06/28/19 0848 18     Temp 06/28/19 0848 98.4 F (36.9 C)     Temp Source 06/28/19 0848 Oral     SpO2 06/28/19 0848 98 %     Weight 06/28/19 0849 130 lb (59 kg)     Height 06/28/19 0849 5\' 9"  (1.753 m)     Head Circumference --      Peak Flow --      Pain Score 06/28/19 0849 6     Pain Loc --      Pain Edu? --      Excl. in GC? --     Constitutional: Alert and oriented. Well appearing and in no acute distress. Eyes: Conjunctivae are normal. PERRL. EOMI. Head: Atraumatic. Nose: No congestion/rhinnorhea. Mouth/Throat: Mucous membranes are moist.  Oropharynx erythematous. Neck: No stridor.   Hematological/Lymphatic/Immunilogical: No cervical lymphadenopathy. Cardiovascular: Normal rate, regular rhythm. Grossly normal heart sounds.  Good peripheral circulation. Respiratory: Normal respiratory effort.  No  retractions. Lungs CTAB. Skin:  Skin is warm, dry and intact. No rash noted. Psychiatric: Mood and affect are normal. Speech and behavior are normal.  ____________________________________________   LABS (all labs ordered are listed, but only abnormal results are displayed)  Labs Reviewed  GROUP A STREP BY PCR   ____________________________________________  EKG   ____________________________________________  RADIOLOGY  ED MD interpretation:    Official radiology report(s): No results found.  ____________________________________________   PROCEDURES  Procedure(s)  performed (including Critical Care):  Procedures   ____________________________________________   INITIAL IMPRESSION / ASSESSMENT AND PLAN / ED COURSE  As part of my medical decision making, I reviewed the following data within the [place] was evaluated in Emergency Department on 06/28/2019 for the symptoms described in the history of present illness. He was evaluated in the context of the global COVID-19 pandemic, which necessitated consideration that the patient might be at risk for infection with the SARS-CoV-2 virus that causes COVID-19. Institutional protocols and algorithms that pertain to the evaluation of patients at risk for COVID-19 are in a state of rapid change based on information released by regulatory bodies including the CDC and federal and state organizations. These policies and algorithms were followed during the patient's care in the ED.     Patient presents with sore throat and decreased voice volume upon a.m. awakening.  Discussed negative strep results with patient.  Patient physical exam is consistent with laryngitis.  Patient given discharge care instruction work note.  Patient advised [facility] condition persist.   ____________________________________________   FINAL CLINICAL IMPRESSION(S) / ED DIAGNOSES  Final diagnoses:  Sore throat  Laryngitis     ED Discharge Orders         Ordered    methylPREDNISolone (MEDROL DOSEPAK) 4 MG TBPK tablet     06/28/19 1001    lidocaine (XYLOCAINE) 2 % solution  Every 6 hours PRN     06/28/19 1001           Note:  This document was prepared using Dragon voice recognition software and may include unintentional dictation errors.    Sable Feil, PA-C 06/28/19 1002    Harvest Dark, MD 06/28/19 1501

## 2019-06-28 NOTE — ED Triage Notes (Signed)
Awoke today with sore throat and hoarse voice. Pt alert and oriented X4, cooperative, RR even and unlabored, color WNL. Pt in NAD.

## 2020-04-15 ENCOUNTER — Emergency Department
Admission: EM | Admit: 2020-04-15 | Discharge: 2020-04-15 | Discharge status: Absent without leave | Payer: Medicaid Other

## 2020-06-26 ENCOUNTER — Other Ambulatory Visit: Payer: Self-pay

## 2020-06-26 ENCOUNTER — Ambulatory Visit
Admission: EM | Admit: 2020-06-26 | Discharge: 2020-06-26 | Disposition: A | Discharge status: Home / Self Care | Payer: Medicaid Other | Attending: Emergency Medicine | Admitting: Emergency Medicine

## 2020-06-26 DIAGNOSIS — R519 Headache, unspecified: Secondary | ICD-10-CM

## 2020-06-26 IMAGING — CR DG CHEST 2V
1 series · 2 of 2 positions shown · non-contrast
Comparison: None.

CLINICAL DATA: Cough and cold symptoms.

EXAM:
CHEST - 2 VIEW

[Series 1: w chest pa · 0.14mm/px · 2 of 2 slices shown]
[im 1/2]
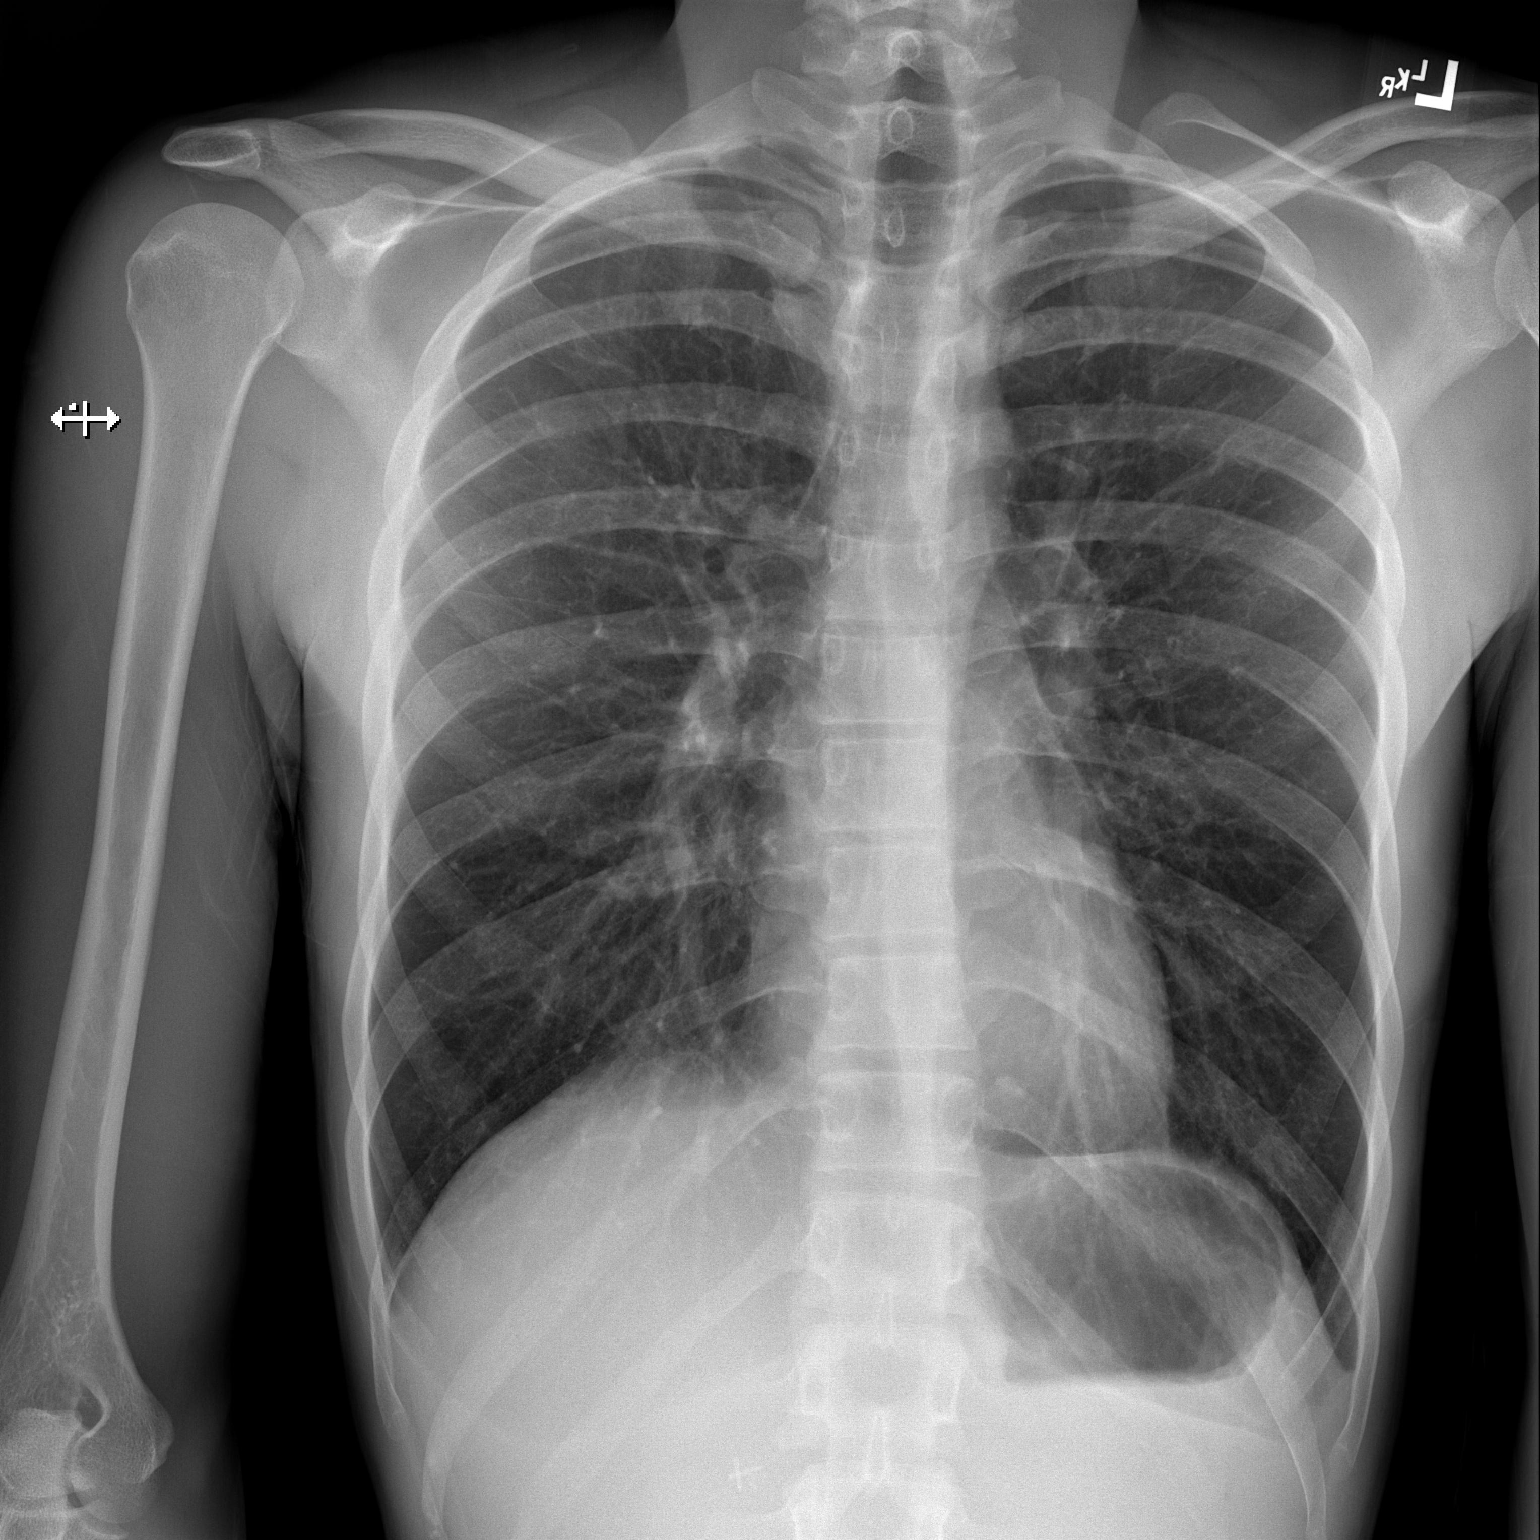
[im 2/2]
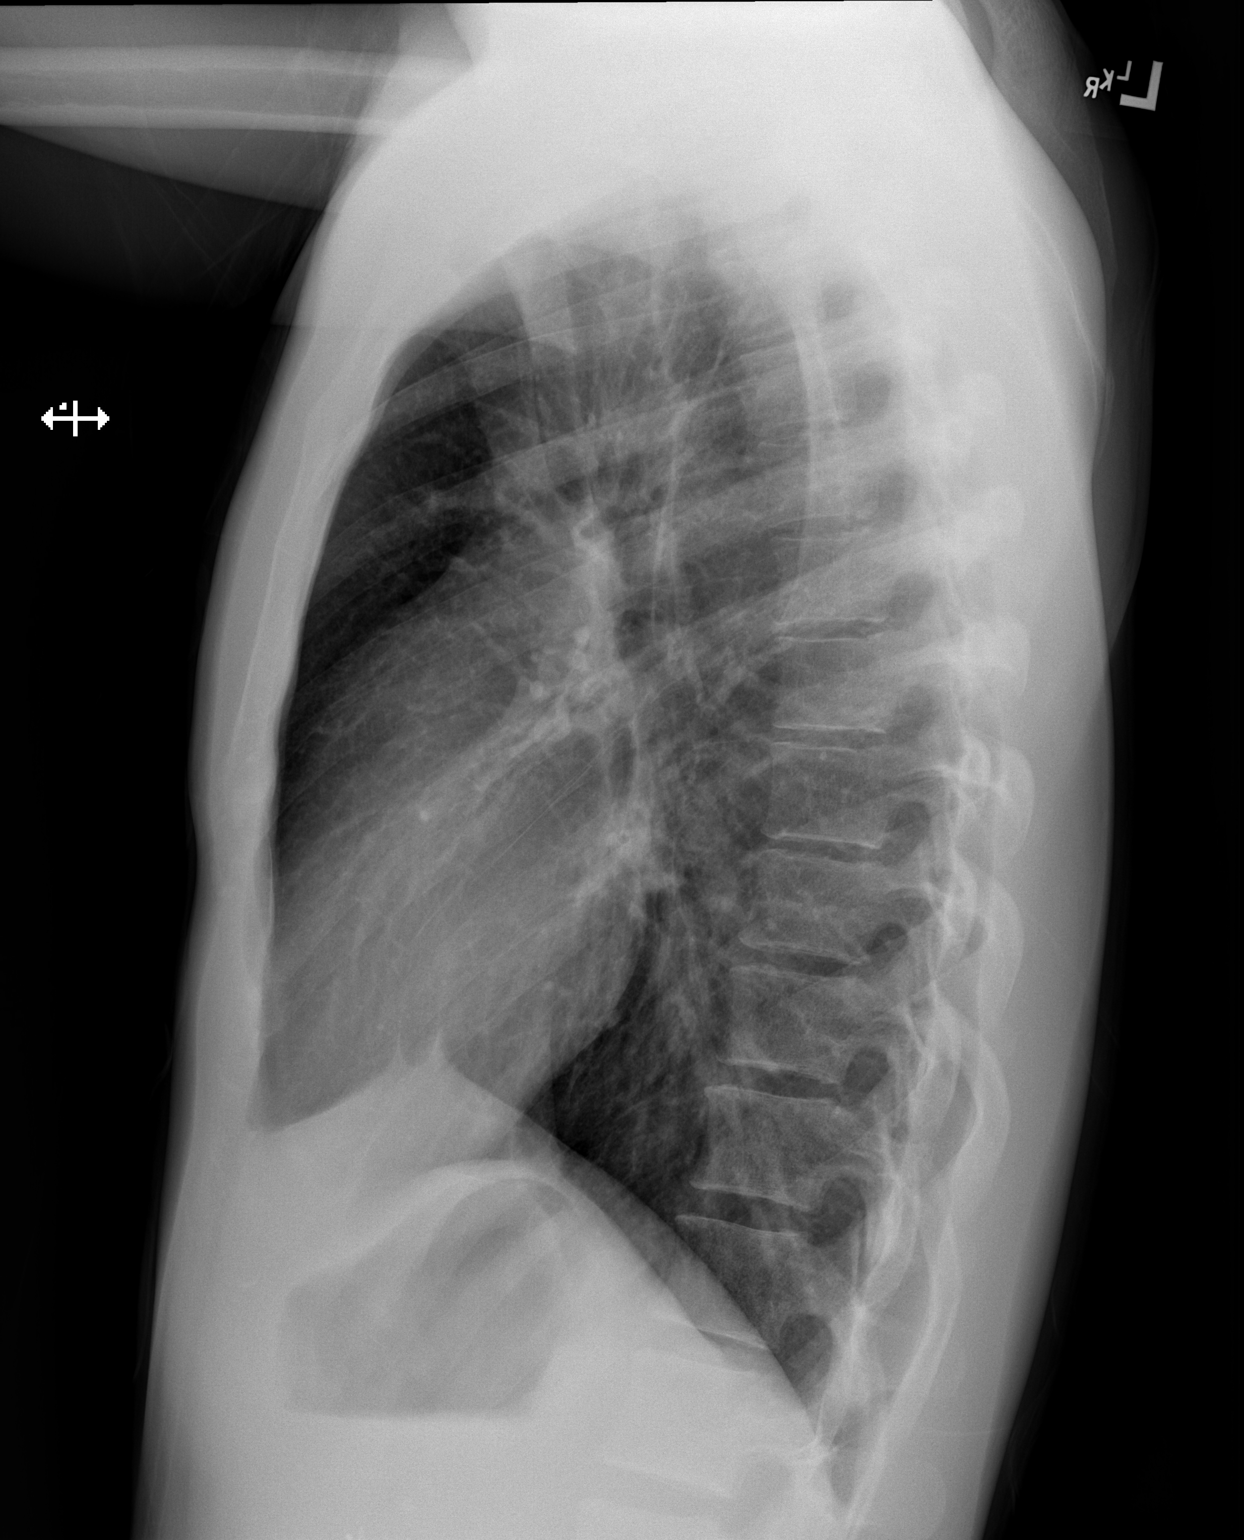

[2 of 2 positions shown; findings below may reference images not displayed]

FINDINGS: The cardiac silhouette, mediastinal hilar contours are normal. The
lungs are clear. No pleural effusion. The bony thorax is intact.
IMPRESSION: No acute cardiopulmonary findings.

## 2020-06-26 NOTE — ED Provider Notes (Signed)
[name]    CSN: [id number] Arrival date & time: 06/26/20  7408      History   Chief Complaint Chief Complaint  Patient presents with  . Headache  . Chills    HPI Ronald Chandler is a 34 y.o. male.   Patient presents with 1 day history of headache and chills.  He denies fever, sore throat, cough, shortness of breath, abdominal pain, vomiting, diarrhea, or other symptoms.  No treatments attempted at home.  The history is provided by the patient.    Past Medical History:  Diagnosis Date  . ADD (attention deficit disorder)   . ADHD   . Seizures (HCC)     There are no problems to display for this patient.   Past Surgical History:  Procedure Laterality Date  . CHOLECYSTECTOMY         Home Medications    Prior to Admission medications   Medication Sig Start Date End Date Taking? Authorizing Provider  lamoTRIgine (LAMICTAL) 100 MG tablet Take 100 mg by mouth daily.    [provider]  lidocaine (XYLOCAINE) 2 % solution Use as directed 5 mLs in the mouth or throat every 6 (six) hours as needed for mouth pain. Oral swish and swallow. 06/28/19   Joni Reining, PA-C  methylPREDNISolone (MEDROL DOSEPAK) 4 MG TBPK tablet Take Tapered dose as directed 06/28/19   Joni Reining, PA-C    Family History History reviewed. No pertinent family history.  Social History Social History   Tobacco Use  . Smoking status: Never Smoker  . Smokeless tobacco: Never Used  Vaping Use  . Vaping Use: Never used  Substance Use Topics  . Alcohol use: No  . Drug use: Yes    Types: Marijuana     Allergies   Patient has no known allergies.   Review of Systems Review of Systems  Constitutional: Positive for chills. Negative for fever.  HENT: Negative for ear pain and sore throat.   Eyes: Negative for pain and visual disturbance.  Respiratory: Negative for cough and shortness of breath.   Cardiovascular: Negative for chest pain and palpitations.    Gastrointestinal: Negative for abdominal pain, diarrhea and vomiting.  Genitourinary: Negative for dysuria and hematuria.  Musculoskeletal: Negative for arthralgias and back pain.  Skin: Negative for color change and rash.  Neurological: Positive for headaches. Negative for seizures, syncope, weakness and numbness.  All other systems reviewed and are negative.    Physical Exam Triage Vital Signs ED Triage Vitals  Enc Vitals Group     BP      Pulse      Resp      Temp      Temp src      SpO2      Weight      Height      Head Circumference      Peak Flow      Pain Score      Pain Loc      Pain Edu?      Excl. in GC?    No data found.  Updated Vital Signs BP 109/66   Pulse 86   Temp 99.3 F (37.4 C)   Resp 14   SpO2 95%   Visual Acuity Right Eye Distance:   Left Eye Distance:   Bilateral Distance:    Right Eye Near:   Left Eye Near:    Bilateral Near:     Physical Exam Vitals and nursing note reviewed.  Constitutional:      General: He is not in acute distress.    Appearance: He is well-developed. He is not ill-appearing.  HENT:     Head: Normocephalic and atraumatic.     Right Ear: Tympanic membrane normal.     Left Ear: Tympanic membrane normal.     Nose: Nose normal.     Mouth/Throat:     Mouth: Mucous membranes are moist.     Pharynx: Oropharynx is clear.  Eyes:     Conjunctiva/sclera: Conjunctivae normal.  Cardiovascular:     Rate and Rhythm: Normal rate and regular rhythm.     Heart sounds: No murmur heard.   Pulmonary:     Effort: Pulmonary effort is normal. No respiratory distress.     Breath sounds: Normal breath sounds.  Abdominal:     Palpations: Abdomen is soft.     Tenderness: There is no abdominal tenderness. There is no guarding or rebound.  Musculoskeletal:     Cervical back: Neck supple.  Skin:    General: Skin is warm and dry.     Findings: No rash.  Neurological:     General: No focal deficit present.     Mental Status:  He is alert and oriented to person, place, and time.     Gait: Gait normal.  Psychiatric:        Mood and Affect: Mood normal.        Behavior: Behavior normal.      UC Treatments / Results  Labs (all labs ordered are listed, but only abnormal results are displayed) Labs Reviewed  NOVEL CORONAVIRUS, NAA    EKG   Radiology No results found.  Procedures Procedures (including critical care time)  Medications Ordered in UC Medications - No data to display  Initial Impression / Assessment and Plan / UC Course  I have reviewed the triage vital signs and the nursing notes.  Pertinent labs & imaging results that were available during my care of the patient were reviewed by me and considered in my medical decision making (see chart for details).   Acute non-intractable headache.  PCR COVID pending.  Instructed patient to self quarantine until the test result is back.  Discussed symptomatic treatment including Tylenol, rest, hydration.  Instructed patient to go to the ED if he has acute worsening symptoms.  Patient agrees to plan of care.    Final Clinical Impressions(s) / UC Diagnoses   Final diagnoses:  Acute nonintractable headache, unspecified headache type     Discharge Instructions     Your COVID test is pending.  You should self quarantine until the test result is back.    Take Tylenol as needed for fever or discomfort.  Rest and keep yourself hydrated.    Go to the emergency department if you develop acute worsening symptoms.        ED Prescriptions    None     PDMP not reviewed this encounter.   Mickie Bail, NP 06/26/20 [phone]

## 2020-06-26 NOTE — Discharge Instructions (Addendum)
Your COVID test is pending.  You should self quarantine until the test result is back.    Take Tylenol as needed for fever or discomfort.  Rest and keep yourself hydrated.    Go to the emergency department if you develop acute worsening symptoms.     

## 2020-06-26 NOTE — ED Triage Notes (Signed)
Patient reports headache onset yesterday afternoon followed by intermittent chills onset last night. Denies fever, cough, sore throat, congestion.

## 2020-06-29 LAB — NOVEL CORONAVIRUS, NAA

## 2020-11-03 IMAGING — CT CT HEAD WITHOUT CONTRAST
3 series · 16 of 47 positions shown, 19 images · non-contrast
Comparison: None.

CLINICAL DATA: Headache after hitting head against solid object

EXAM:
CT HEAD WITHOUT CONTRAST
TECHNIQUE: Contiguous axial images were obtained from the base of the skull
through the vertex without intravenous contrast.

[Series 3: head wo · axial · 0.47mm/px · z∈[-178,-48]mm · 10 of 32 slices shown, 13 images]
[im 3/32  brain]
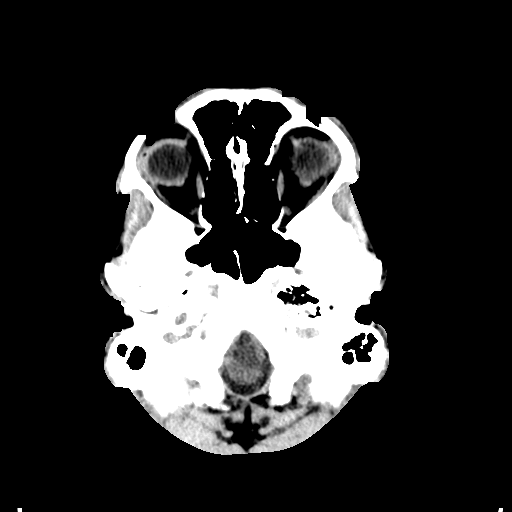
[im 3/32  bone]
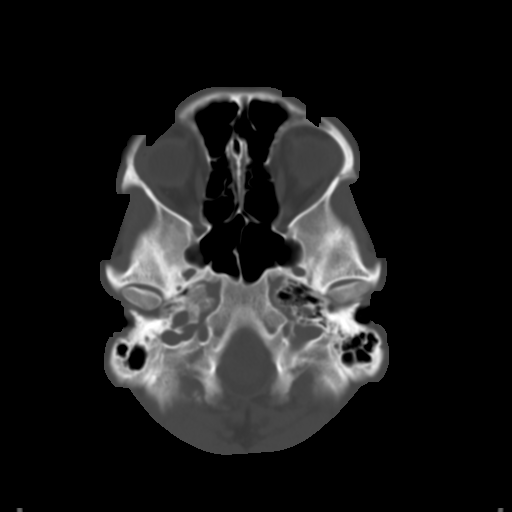
[im 6/32  brain]
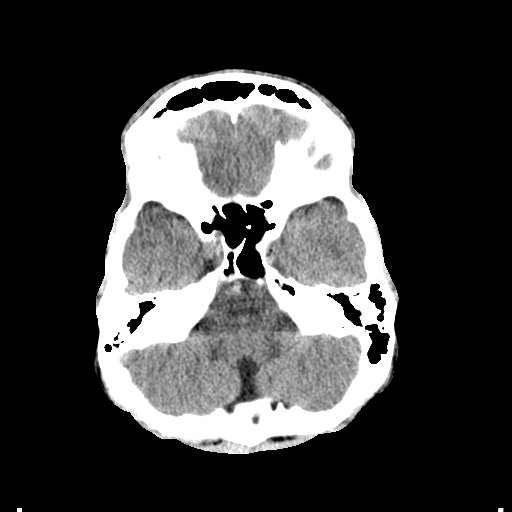
[im 9/32  brain]
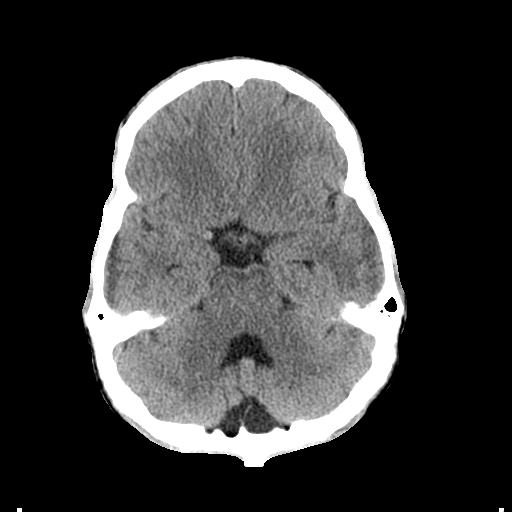
[im 11/32  brain]
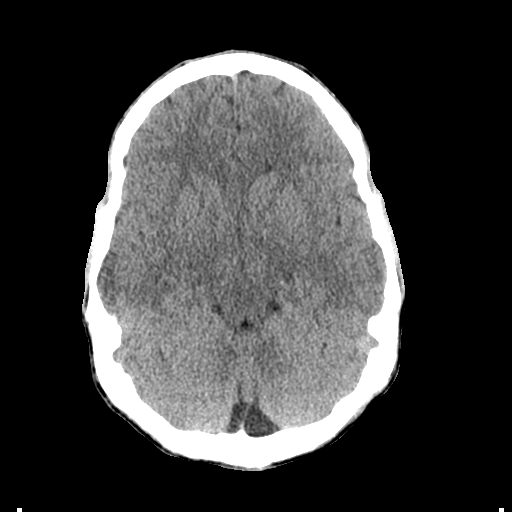
[im 14/32  brain]
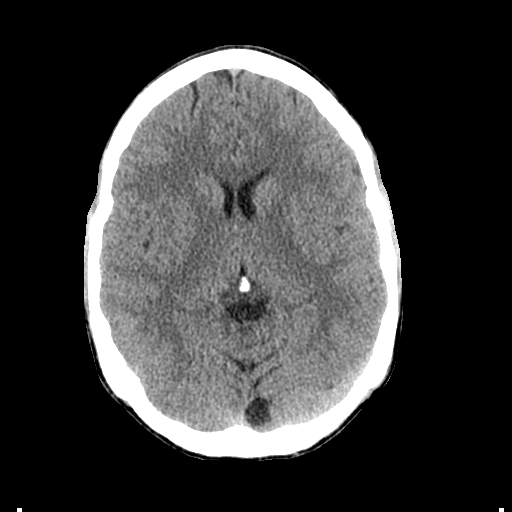
[im 14/32  bone]
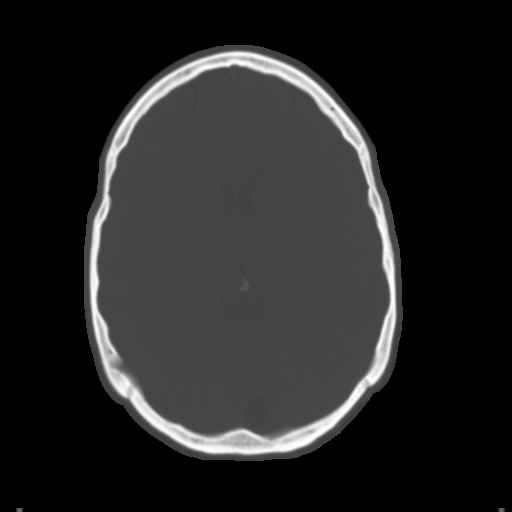
[im 18/32  brain]
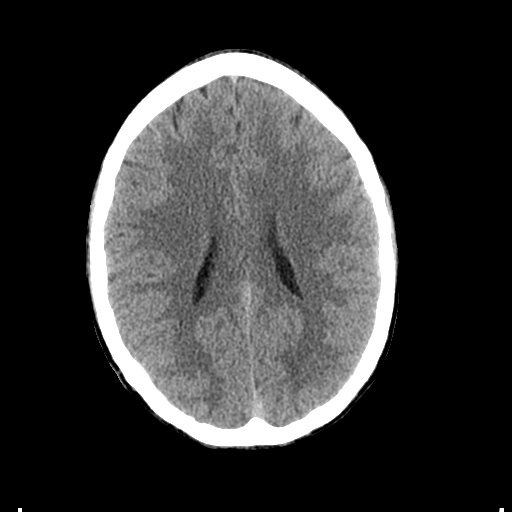
[im 21/32  brain]
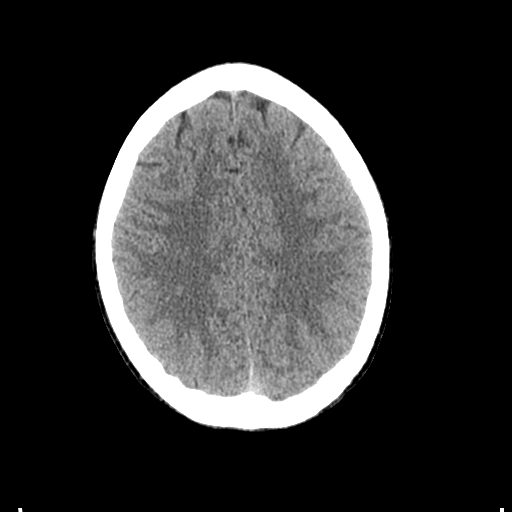
[im 24/32  brain]
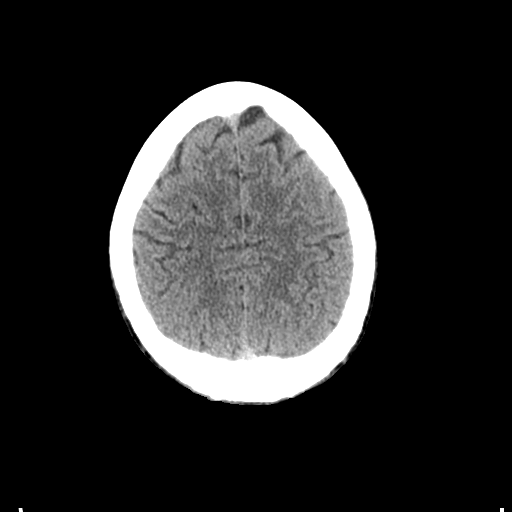
[im 26/32  brain]
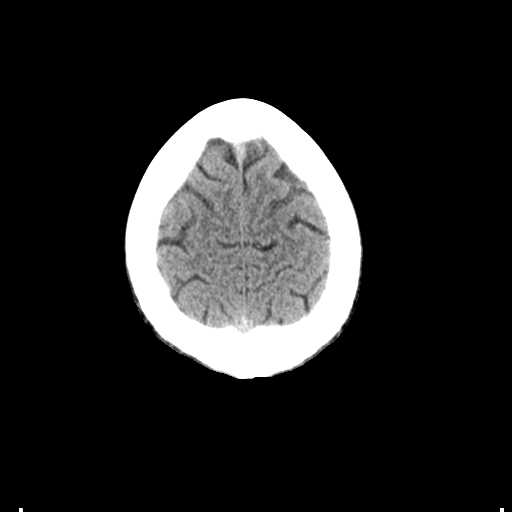
[im 26/32  bone]
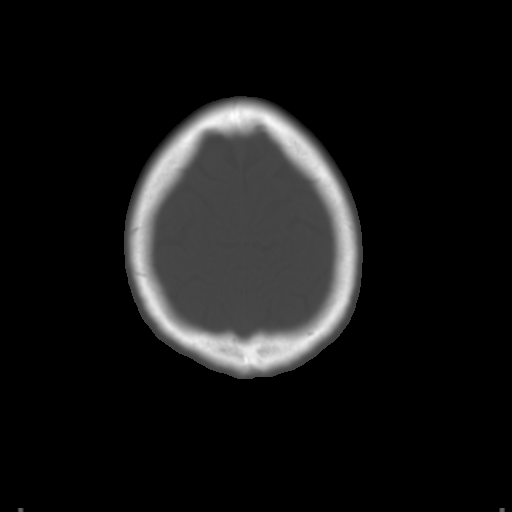
[im 29/32  brain]
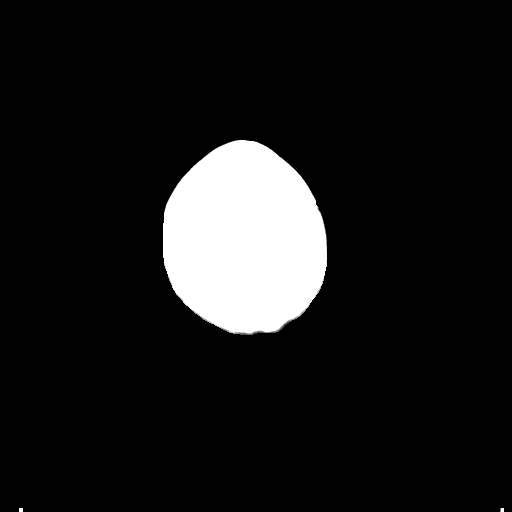

[Series 4: coronal soft tissue · coronal · 0.32mm/px · 3 of 71 slices shown]
[im 24/71  brain]
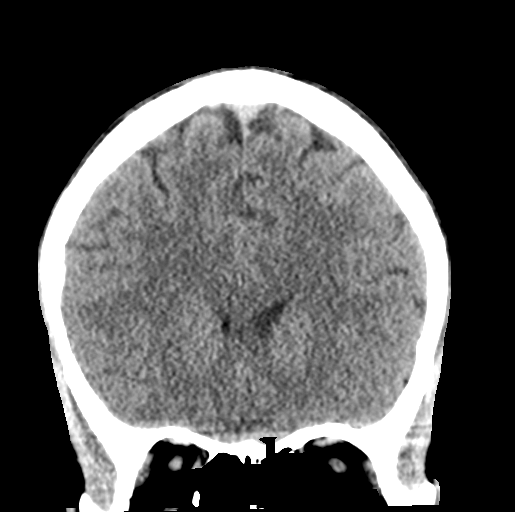
[im 32/71  brain]
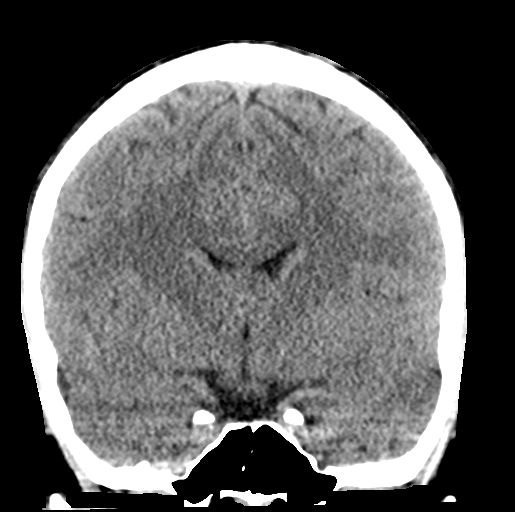
[im 39/71  brain]
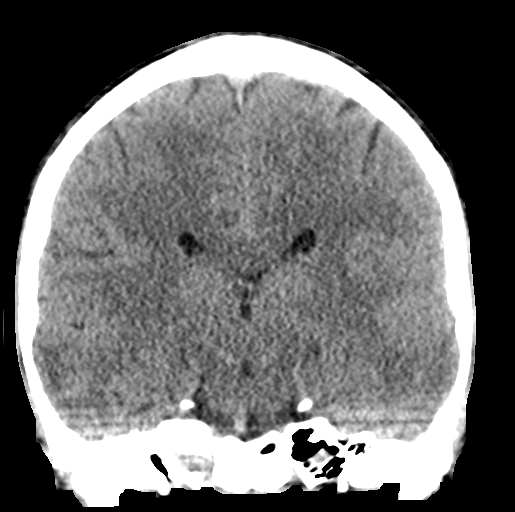

[Series 5: sagittal soft tissue · sagittal · 0.32mm/px · 3 of 56 slices shown]
[im 19/56  brain]
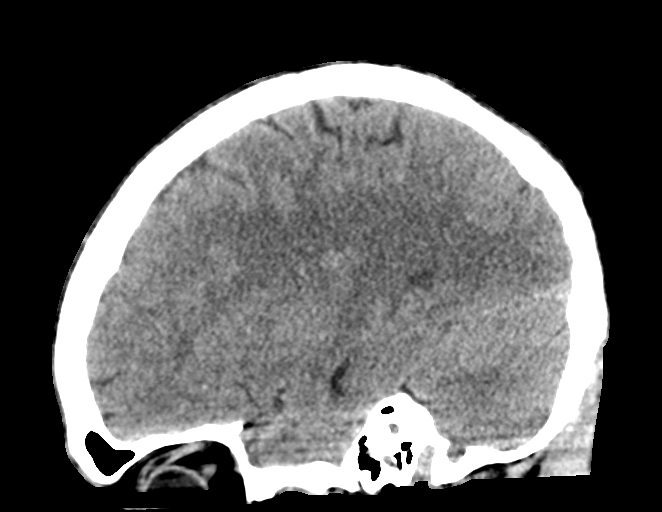
[im 28/56  brain]
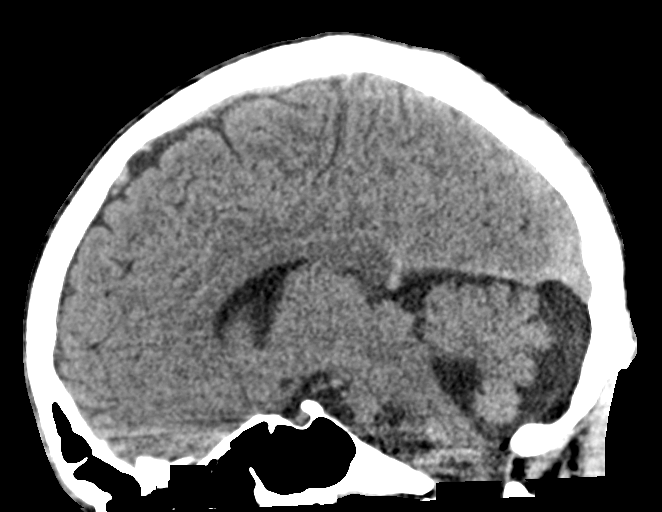
[im 37/56  brain]
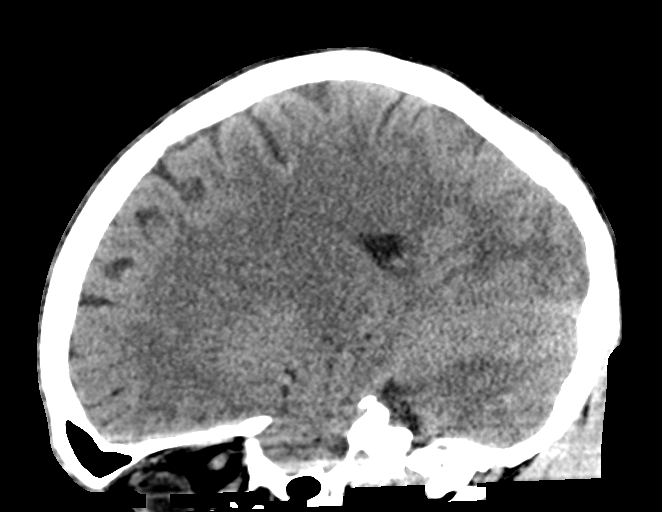

[16 of 47 positions shown; findings below may reference images not displayed]

FINDINGS: Brain: The ventricles are normal in size and configuration. There is
prominence of the cisterna magna, an anatomic variant. There is no
intracranial mass, hemorrhage, extra-axial fluid collection, or
midline shift. Brain parenchyma appears unremarkable. No acute
infarct evident.

Vascular: There is no hyperdense vessel. There is no appreciable
vascular calcification.

Skull: The bony calvarium appears intact.

Sinuses/Orbits: There is a small retention cyst in a right ethmoid
air cell. Other visualized paranasal sinuses are clear. Visualized
orbits appear symmetric bilaterally.

Other: Mastoid air cells are clear.
IMPRESSION: Small retention cyst in a right ethmoid air cell. Study otherwise
unremarkable. Prominent cisterna magna is an anatomic variant.
